# Patient Record
Sex: Male | Born: 2005 | Race: White | Hispanic: No | Marital: Single | Smoking: Never smoker
Health system: Southern US, Community
[De-identification: ages and names within clinical notes are randomized; demographics above are authoritative.]

## PROBLEM LIST (undated history)

## (undated) DIAGNOSIS — J45909 Unspecified asthma, uncomplicated: Secondary | ICD-10-CM

---
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
## 2023-03-07 ENCOUNTER — Other Ambulatory Visit: Payer: Self-pay

## 2023-03-07 ENCOUNTER — Telehealth: Payer: Self-pay | Admitting: Family Medicine

## 2023-03-07 ENCOUNTER — Ambulatory Visit: Payer: Medicaid Other

## 2023-03-07 ENCOUNTER — Ambulatory Visit
Admission: RE | Admit: 2023-03-07 | Discharge: 2023-03-07 | Disposition: A | Discharge status: Home / Self Care | Payer: Medicaid Other | Source: Ambulatory Visit | Attending: Family Medicine | Admitting: Family Medicine

## 2023-03-07 VITALS — BP 111/67 | HR 89 | Temp 98.1°F | Resp 18

## 2023-03-07 DIAGNOSIS — R0789 Other chest pain: Secondary | ICD-10-CM | POA: Diagnosis not present

## 2023-03-07 DIAGNOSIS — J45901 Unspecified asthma with (acute) exacerbation: Secondary | ICD-10-CM | POA: Diagnosis not present

## 2023-03-07 MED ORDER — PREDNISONE 20 MG PO TABS: ORAL_TABLET | ORAL | 0 refills | Status: DC

## 2023-03-07 MED ORDER — MONTELUKAST SODIUM 10 MG PO TABS: ORAL_TABLET | ORAL | 0 refills | Status: AC

## 2023-03-07 NOTE — ED Provider Notes (Signed)
[facility]    CSN: [id number] Arrival date & time: 03/07/23  1151      History   Chief Complaint Chief Complaint  Patient presents with   Cough    HPI Anthony Ruiz is a 18 y.o. male.   Patient reports that he had a cold about 1.5 to two weeks ago that resolved completely.  After he was well he went for a one mile run and two days later he began to experience a sensation of tightness in his anterior chest and shortness of breath during activity.  The sensation of shortness of breath has persisted, but is worse at work, improving somewhat with his albuterol inhaler.  He states that he must use his inhaler multiple times some nights. Patient has a history of mild asthma that has been quiescent during the past several years.  He notes an occasional semi-productive cough, but denies pleuritic pain and fevers, chills, and sweats.  The history is provided by the patient and a parent.    Past Medical History:  Diagnosis Date   Asthma     There are no active problems to display for this patient.   History reviewed. No pertinent surgical history.     Home Medications    Prior to Admission medications   Medication Sig Start Date End Date Taking? Authorizing Provider  montelukast (SINGULAIR) 10 MG tablet Take one tab PO HS 03/07/23  Yes Deshan Hemmelgarn, Tera Mater, MD  cetirizine (ZYRTEC ALLERGY) 10 MG tablet Take 1 tablet (10 mg total) by mouth daily. 03/11/22 06/09/22  Theadora Rama Scales, PA-C  EPINEPHrine (EPI-PEN) 0.3 mg/0.3 mL SOAJ injection Inject into the muscle once.    [provider]  fluticasone (FLONASE) 50 MCG/ACT nasal spray Place 1 spray into both nostrils daily. Begin by using 2 sprays in each nare daily for 3 to 5 days, then decrease to 1 spray in each nare daily. 03/11/22   Theadora Rama Scales, PA-C  ipratropium-albuterol (DUONEB) 0.5-2.5 (3) MG/3ML SOLN Take 3 mLs by nebulization 4 (four) times daily as needed (Cough, wheezing, shortness of breath).  03/11/22 06/09/22  Theadora Rama Scales, PA-C  VENTOLIN HFA 108 (90 Base) MCG/ACT inhaler Inhale 2 puffs into the lungs every 6 (six) hours as needed.    [provider]    Family History Family History  Problem Relation Age of Onset   Heart disease Father    Asthma Mother     Social History Social History   Tobacco Use   Smoking status: Never   Smokeless tobacco: Never     Allergies   Peanuts [peanut oil]   Review of Systems Review of Systems No sore throat + cough No pleuritic pain No wheezing No nasal congestion No post-nasal drainage No sinus pain/pressure No itchy/red eyes No earache No hemoptysis + SOB No fever/chills No nausea No vomiting No abdominal pain No diarrhea No urinary symptoms No skin rash No fatigue No myalgias No headache   Physical Exam Triage Vital Signs ED Triage Vitals  Encounter Vitals Group     BP 03/07/23 1220 111/67     Systolic BP Percentile --      Diastolic BP Percentile --      Pulse Rate 03/07/23 1220 89     Resp 03/07/23 1220 18     Temp 03/07/23 1220 98.1 F (36.7 C)     Temp Source 03/07/23 1220 Oral     SpO2 03/07/23 1220 97 %     Weight --  Height --      Head Circumference --      Peak Flow --      Pain Score 03/07/23 1213 0     Pain Loc --      Pain Education --      Exclude from Growth Chart --    No data found.  Updated Vital Signs BP 111/67   Pulse 89   Temp 98.1 F (36.7 C) (Oral)   Resp 18   SpO2 97%   Visual Acuity Right Eye Distance:   Left Eye Distance:   Bilateral Distance:    Right Eye Near:   Left Eye Near:    Bilateral Near:     Physical Exam Nursing notes and Vital Signs reviewed. Appearance:  Patient appears stated age, and in no acute distress Eyes:  Pupils are equal, round, and reactive to light and accomodation.  Extraocular movement is intact.  Conjunctivae are not inflamed  Ears:  Canals normal.  Tympanic membranes normal.  Nose: Normal turbinates.  No  sinus tenderness.  Pharynx:  Normal Neck:  Supple. No adenopathy.  Chest:  Sternum is mildly depressed but there is no tenderness to palpation. Lungs:  Clear to auscultation.  Breath sounds are equal.  Moving air well. Heart:  Regular rate and rhythm without murmurs, rubs, or gallops.  Abdomen:  Nontender without masses or hepatosplenomegaly.  Bowel sounds are present.  No CVA or flank tenderness.  Extremities:  No edema.  Skin:  No rash present.   UC Treatments / Results  Labs (all labs ordered are listed, but only abnormal results are displayed) Labs Reviewed - No data to display  EKG   Radiology No results found.  Procedures Procedures (including critical care time)  Medications Ordered in UC Medications - No data to display  Initial Impression / Assessment and Plan / UC Course  I have reviewed the triage vital signs and the nursing notes.  Pertinent labs & imaging results that were available during my care of the patient were reviewed by me and considered in my medical decision making (see chart for details).    Chest x-ray appears benign with no acute changes (my reading). Suspect mild reactive airways disease. Begin trial of Singulair 10mg  HS (#30, no refill). Followup with Family Doctor if not improved in about 3 weeks.  Final Clinical Impressions(s) / UC Diagnoses   Final diagnoses:  Sensation of chest tightness  Mild asthma exacerbation     Discharge Instructions      You may still use your albuterol inhaler as needed.  Someone will call you with the final interpretation of your chest x-ray results.     ED Prescriptions     Medication Sig Dispense Auth. Provider   montelukast (SINGULAIR) 10 MG tablet Take one tab PO HS 30 tablet Lattie Haw, MD      8:56pm  Final chest x-ray report: peribronchial thickening (viral vs reactive). Patient notified. PLAN:  add prednisone burst/taper (Rx sent) in addition to Singulair already prescribed.        Lattie Haw, MD 03/08/23 [phone]

## 2023-03-07 NOTE — ED Triage Notes (Signed)
Patient presents to [facility] for evaluation of shortness of breath episodes at night when sleeping, waking him up, feel like a severe asthma attack.  Patient states he uses his rescue in haler and gets some relief, but has had to do it multiple times some nights.  Patient states he has had some nausea in the mornings when waking.  C/o semi-productive cough, yellow in the morning nsd turning clear in the afternoons.

## 2023-03-07 NOTE — Discharge Instructions (Signed)
You may still use your albuterol inhaler as needed.  Someone will call you with the final interpretation of your chest x-ray results.

## 2023-03-07 NOTE — Telephone Encounter (Signed)
Final chest x-ray report: peribronchial thickening (viral vs reactive). Patient notified. PLAN:  add prednisone burst/taper, in addition to Singulair already prescribed.

## 2023-03-30 ENCOUNTER — Ambulatory Visit
Admission: RE | Admit: 2023-03-30 | Discharge: 2023-03-30 | Disposition: A | Discharge status: Home / Self Care | Payer: Self-pay | Source: Ambulatory Visit | Attending: Family Medicine | Admitting: Family Medicine

## 2023-03-30 ENCOUNTER — Other Ambulatory Visit: Payer: Self-pay

## 2023-03-30 VITALS — BP 130/72 | HR 71 | Temp 98.4°F | Resp 19 | Ht 69.0 in | Wt 152.0 lb

## 2023-03-30 DIAGNOSIS — J4521 Mild intermittent asthma with (acute) exacerbation: Secondary | ICD-10-CM | POA: Diagnosis not present

## 2023-03-30 MED ORDER — PREDNISONE 10 MG (21) PO TBPK: ORAL_TABLET | Freq: Every day | ORAL | 0 refills | Status: DC

## 2023-03-30 NOTE — Discharge Instructions (Addendum)
 Advised patient to take medication as directed with food to completion.  Encouraged increase daily water intake to 64 ounces per day while taking this medication. Advised if symptoms worsen and/or unresolved please follow-up with your PCP or here for further evaluation.

## 2023-03-30 NOTE — ED Triage Notes (Signed)
 Pt states that he was recently seen for having sob. Pt states that he is here for sob and wheezing. X2 weeks

## 2023-03-30 NOTE — ED Provider Notes (Signed)
 [facility]    CSN: [id number] Arrival date & time: 03/30/23  1130      History   Chief Complaint Chief Complaint  Patient presents with   Wheezing    Asthma issues - Entered by patient    HPI Anthony Ruiz is a 18 y.o. male.   HPI 18 year old male presents with possible asthma exacerbation, she reports wheezing x 2 weeks.  PMH significant for asthma.  Past Medical History:  Diagnosis Date   Asthma     There are no active problems to display for this patient.   History reviewed. No pertinent surgical history.     Home Medications    Prior to Admission medications   Medication Sig Start Date End Date Taking? Authorizing Provider  EPINEPHrine (EPI-PEN) 0.3 mg/0.3 mL SOAJ injection Inject into the muscle once.   Yes [provider]  fluticasone  (FLONASE ) 50 MCG/ACT nasal spray Place 1 spray into both nostrils daily. Begin by using 2 sprays in each nare daily for 3 to 5 days, then decrease to 1 spray in each nare daily. 03/11/22  Yes Joesph Shaver Scales, PA-C  montelukast  (SINGULAIR ) 10 MG tablet Take one tab PO HS 03/07/23  Yes Beese, Garnette LABOR, MD  predniSONE  (STERAPRED UNI-PAK 21 TAB) 10 MG (21) TBPK tablet Take by mouth daily. Take 6 tabs by mouth daily  for 2 days, then 5 tabs for 2 days, then 4 tabs for 2 days, then 3 tabs for 2 days, 2 tabs for 2 days, then 1 tab by mouth daily for 2 days 03/30/23  Yes Teddy Sharper, FNP  VENTOLIN  HFA 108 (90 Base) MCG/ACT inhaler Inhale 2 puffs into the lungs every 6 (six) hours as needed.   Yes [provider]  cetirizine  (ZYRTEC  ALLERGY) 10 MG tablet Take 1 tablet (10 mg total) by mouth daily. 03/11/22 06/09/22  Joesph Shaver Scales, PA-C  ipratropium-albuterol  (DUONEB) 0.5-2.5 (3) MG/3ML SOLN Take 3 mLs by nebulization 4 (four) times daily as needed (Cough, wheezing, shortness of breath). 03/11/22 06/09/22  Joesph Shaver Scales, PA-C    Family History Family History  Problem Relation Age of Onset    Heart disease Father    Asthma Mother     Social History Social History   Tobacco Use   Smoking status: Never   Smokeless tobacco: Never  Vaping Use   Vaping status: Never Used  Substance Use Topics   Alcohol use: Never   Drug use: Never     Allergies   Peanuts [peanut oil]   Review of Systems Review of Systems   Physical Exam Triage Vital Signs ED Triage Vitals  Encounter Vitals Group     BP 03/30/23 1158 130/72     Systolic BP Percentile --      Diastolic BP Percentile --      Pulse Rate 03/30/23 1158 71     Resp 03/30/23 1158 19     Temp 03/30/23 1158 98.4 F (36.9 C)     Temp Source 03/30/23 1158 Oral     SpO2 03/30/23 1158 99 %     Weight 03/30/23 1157 152 lb (68.9 kg)     Height 03/30/23 1157 5' 9 (1.753 m)     Head Circumference --      Peak Flow --      Pain Score 03/30/23 1157 0     Pain Loc --      Pain Education --      Exclude from Growth Chart --  No data found.  Updated Vital Signs BP 130/72 (BP Location: Left Arm)   Pulse 71   Temp 98.4 F (36.9 C) (Oral)   Resp 19   Ht 5' 9 (1.753 m)   Wt 152 lb (68.9 kg)   SpO2 99%   BMI 22.45 kg/m   Visual Acuity Right Eye Distance:   Left Eye Distance:   Bilateral Distance:    Right Eye Near:   Left Eye Near:    Bilateral Near:     Physical Exam Vitals and nursing note reviewed.  Constitutional:      Appearance: Normal appearance. He is normal weight.  HENT:     Head: Normocephalic and atraumatic.     Right Ear: Tympanic membrane, ear canal and external ear normal.     Left Ear: Tympanic membrane, ear canal and external ear normal.     Nose: Nose normal.     Mouth/Throat:     Mouth: Mucous membranes are moist.     Pharynx: Oropharynx is clear.  Eyes:     Extraocular Movements: Extraocular movements intact.     Conjunctiva/sclera: Conjunctivae normal.     Pupils: Pupils are equal, round, and reactive to light.  Cardiovascular:     Rate and Rhythm: Normal rate and regular  rhythm.     Pulses: Normal pulses.     Heart sounds: Normal heart sounds.  Pulmonary:     Effort: Pulmonary effort is normal.     Breath sounds: Normal breath sounds. No wheezing, rhonchi or rales.  Musculoskeletal:     Cervical back: Normal range of motion and neck supple.  Skin:    General: Skin is warm and dry.  Neurological:     General: No focal deficit present.     Mental Status: He is alert and oriented to person, place, and time.      UC Treatments / Results  Labs (all labs ordered are listed, but only abnormal results are displayed) Labs Reviewed - No data to display  EKG   Radiology No results found.  Procedures Procedures (including critical care time)  Medications Ordered in UC Medications - No data to display  Initial Impression / Assessment and Plan / UC Course  I have reviewed the triage vital signs and the nursing notes.  Pertinent labs & imaging results that were available during my care of the patient were reviewed by me and considered in my medical decision making (see chart for details).     MDM: 1.  Mild intermittent asthma with exacerbation-Rx'd Sterapred Unipak (tapering from 60 mg to 10 mg over 10 days). Advised patient to take medication as directed with food to completion.  Encouraged increase daily water intake to 64 ounces per day while taking this medication.  Advised if symptoms worsen and/or unresolved please follow-up with your PCP or here for further evaluation.  Final Clinical Impressions(s) / UC Diagnoses   Final diagnoses:  Mild intermittent asthma with exacerbation     Discharge Instructions      Advised patient to take medication as directed with food to completion.  Encouraged increase daily water intake to 64 ounces per day while taking this medication.  Advised if symptoms worsen and/or unresolved please follow-up with your PCP or here for further evaluation.     ED Prescriptions     Medication Sig Dispense Auth.  Provider   predniSONE  (STERAPRED UNI-PAK 21 TAB) 10 MG (21) TBPK tablet Take by mouth daily. Take 6 tabs by mouth daily  for 2 days, then 5 tabs for 2 days, then 4 tabs for 2 days, then 3 tabs for 2 days, 2 tabs for 2 days, then 1 tab by mouth daily for 2 days 42 tablet Teddy Sharper, FNP      PDMP not reviewed this encounter.   Teddy Sharper, FNP 03/30/23 1223

## 2023-07-07 ENCOUNTER — Ambulatory Visit
Admission: RE | Admit: 2023-07-07 | Discharge: 2023-07-07 | Disposition: A | Discharge status: Home / Self Care | Source: Ambulatory Visit | Attending: Family Medicine | Admitting: Family Medicine

## 2023-07-07 ENCOUNTER — Other Ambulatory Visit: Payer: Self-pay

## 2023-07-07 VITALS — BP 112/76 | HR 92 | Temp 99.6°F | Resp 16

## 2023-07-07 DIAGNOSIS — J069 Acute upper respiratory infection, unspecified: Secondary | ICD-10-CM | POA: Diagnosis not present

## 2023-07-07 MED ORDER — AZITHROMYCIN 250 MG PO TABS: ORAL_TABLET | ORAL | 0 refills | Status: AC

## 2023-07-07 MED ORDER — PREDNISONE 20 MG PO TABS: 40.0000 mg | ORAL_TABLET | Freq: Every day | ORAL | 0 refills | Status: AC

## 2023-07-07 NOTE — ED Triage Notes (Signed)
 X 2 days has had cough, sore throat, some nasal congestion. Younger brother has bronchitis. Denies fever. No otc meds.

## 2023-07-07 NOTE — Discharge Instructions (Signed)
 Make sure you are drinking lots of fluids Take the prednisone  once a day for 5 days.  This will help with cough and any wheezing I have given a written prescription for azithromycin .  Fill and take this if you fail to improve as expected May use over-the-counter cough medicines as needed

## 2023-07-07 NOTE — ED Provider Notes (Signed)
 [facility]    CSN: [id number] Arrival date & time: 07/07/23  1155      History   Chief Complaint Chief Complaint  Patient presents with   Sore Throat   Cough    HPI Anthony Ruiz is a 18 y.o. male.   Pleasant 18 year old.  Here for cough.  Has a history of asthma.  Has a history of bronchitis in the past.  Mother sent him in for evaluation because of a cough for the last 2 to 3 days.  Younger brother has bronchitis.  Younger brother was tested and did not have flu or COVID.  [name] states that he is having bad coughing spells.  His chest feels tight.  His chest hurts when he coughs    Past Medical History:  Diagnosis Date   Asthma     There are no active problems to display for this patient.   History reviewed. No pertinent surgical history.     Home Medications    Prior to Admission medications   Medication Sig Start Date End Date Taking? Authorizing Provider  azithromycin  (ZITHROMAX  Z-PAK) 250 MG tablet Take two pills today followed by one a day until gone 07/07/23  Yes Stephany Ehrich, MD  predniSONE  (DELTASONE ) 20 MG tablet Take 2 tablets (40 mg total) by mouth daily with breakfast. 07/07/23  Yes Stephany Ehrich, MD  EPINEPHrine (EPI-PEN) 0.3 mg/0.3 mL SOAJ injection Inject into the muscle once.    [provider]  montelukast  (SINGULAIR ) 10 MG tablet Take one tab PO HS 03/07/23   Leon Rajas, MD  VENTOLIN  HFA 108 (90 Base) MCG/ACT inhaler Inhale 2 puffs into the lungs every 6 (six) hours as needed.    [provider]    Family History Family History  Problem Relation Age of Onset   Heart disease Father    Asthma Mother     Social History Social History   Tobacco Use   Smoking status: Never   Smokeless tobacco: Never  Vaping Use   Vaping status: Never Used  Substance Use Topics   Alcohol use: Never   Drug use: Never     Allergies   Peanuts [peanut oil]   Review of Systems Review of Systems See  HPI  Physical Exam Triage Vital Signs ED Triage Vitals  Encounter Vitals Group     BP 07/07/23 1211 112/76     Systolic BP Percentile --      Diastolic BP Percentile --      Pulse Rate 07/07/23 1211 92     Resp 07/07/23 1211 16     Temp 07/07/23 1211 99.6 F (37.6 C)     Temp src --      SpO2 07/07/23 1211 99 %     Weight --      Height --      Head Circumference --      Peak Flow --      Pain Score 07/07/23 1214 3     Pain Loc --      Pain Education --      Exclude from Growth Chart --    No data found.  Updated Vital Signs BP 112/76   Pulse 92   Temp 99.6 F (37.6 C)   Resp 16   SpO2 99%       Physical Exam Constitutional:      General: He is not in acute distress.    Appearance: He is well-developed and normal weight.  HENT:  Head: Normocephalic and atraumatic.     Right Ear: Tympanic membrane and ear canal normal.     Left Ear: Tympanic membrane and ear canal normal.     Nose: Nose normal. No congestion.     Mouth/Throat:     Mouth: Mucous membranes are moist.     Pharynx: Posterior oropharyngeal erythema present.  Eyes:     Conjunctiva/sclera: Conjunctivae normal.     Pupils: Pupils are equal, round, and reactive to light.  Cardiovascular:     Rate and Rhythm: Normal rate and regular rhythm.     Heart sounds: Normal heart sounds.  Pulmonary:     Effort: Pulmonary effort is normal. No respiratory distress.     Breath sounds: Wheezing present.  Abdominal:     General: There is no distension.     Palpations: Abdomen is soft.  Musculoskeletal:        General: Normal range of motion.     Cervical back: Normal range of motion.  Lymphadenopathy:     Cervical: No cervical adenopathy.  Skin:    General: Skin is warm and dry.  Neurological:     Mental Status: He is alert.      UC Treatments / Results  Labs (all labs ordered are listed, but only abnormal results are displayed) Labs Reviewed - No data to display  EKG   Radiology No results  found.  Procedures Procedures (including critical care time)  Medications Ordered in UC Medications - No data to display  Initial Impression / Assessment and Plan / UC Course  I have reviewed the triage vital signs and the nursing notes.  Pertinent labs & imaging results that were available during my care of the patient were reviewed by me and considered in my medical decision making (see chart for details).     Reviewed that most bronchitis is caused by a virus.  It is appropriate to treat his wheezing from the asthma with some steroids, he has albuterol  at home.  May use over-the-counter cough or cold medicines.  I did give him a written prescription for an antibiotic to fill and take if he fails to improve as expected Final Clinical Impressions(s) / UC Diagnoses   Final diagnoses:  Viral URI with cough     Discharge Instructions      Make sure you are drinking lots of fluids Take the prednisone  once a day for 5 days.  This will help with cough and any wheezing I have given a written prescription for azithromycin .  Fill and take this if you fail to improve as expected May use over-the-counter cough medicines as needed  ED Prescriptions     Medication Sig Dispense Auth. Provider   predniSONE  (DELTASONE ) 20 MG tablet Take 2 tablets (40 mg total) by mouth daily with breakfast. 10 tablet Stephany Ehrich, MD   azithromycin  (ZITHROMAX  Z-PAK) 250 MG tablet Take two pills today followed by one a day until gone 6 tablet Nicholette Barley Cleola Dach, MD      PDMP not reviewed this encounter.   Stephany Ehrich, MD 07/07/23 [phone]

## 2023-10-17 ENCOUNTER — Ambulatory Visit: Payer: Self-pay
